# Patient Record
Sex: Male | Born: 1995 | Race: White | Hispanic: No | Marital: Married | Smoking: Never smoker
Health system: Southern US, Community
[De-identification: ages and names within clinical notes are randomized; demographics above are authoritative.]

---
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
## 2015-11-21 ENCOUNTER — Ambulatory Visit (INDEPENDENT_AMBULATORY_CARE_PROVIDER_SITE_OTHER): Payer: PRIVATE HEALTH INSURANCE | Admitting: Internal Medicine

## 2015-11-21 VITALS — BP 116/72 | HR 95 | Temp 98.1°F | Resp 18 | Ht 72.25 in | Wt 170.4 lb

## 2015-11-21 DIAGNOSIS — Z0000 Encounter for general adult medical examination without abnormal findings: Secondary | ICD-10-CM | POA: Diagnosis not present

## 2015-11-21 DIAGNOSIS — N529 Male erectile dysfunction, unspecified: Secondary | ICD-10-CM | POA: Diagnosis not present

## 2015-11-21 LAB — LIPID PANEL
CHOL/HDL RATIO: 4.4 ratio (ref <=5.0)
Cholesterol: 149 mg/dL (ref 125–170)
HDL: 34 mg/dL (ref 31–65)
LDL Cholesterol: 97 mg/dL (ref <110)
Triglycerides: 90 mg/dL (ref 38–152)
VLDL: 18 mg/dL (ref <30)

## 2015-11-21 LAB — TSH: TSH: 2.26 m[IU]/L (ref 0.50–4.30)

## 2015-11-21 NOTE — Patient Instructions (Signed)
     IF you received an x-ray today, you will receive an invoice from [facility]. Please contact [facility] at [phone] with questions or concerns regarding your invoice.   IF you received labwork today, you will receive an invoice from [facility]. Please contact [facility] at [phone] with questions or concerns regarding your invoice.   Our billing staff will not be able to assist you with questions regarding bills from these companies.  You will be contacted with the lab results as soon as they are available. The fastest way to get your results is to activate your My Chart account. Instructions are located on the last page of this paperwork. If you have not heard from us regarding the results in 2 weeks, please contact this office.      
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
## 2015-11-29 LAB — TESTOS,TOTAL,FREE AND SHBG (FEMALE)
Sex Hormone Binding Glob.: 36 nmol/L (ref 10–50)
Testosterone, Free: 117.6 pg/mL (ref 35.0–155.0)
Testosterone,Total,LC/MS/MS: 749 ng/dL (ref 250–1100)

## 2015-11-30 ENCOUNTER — Encounter: Payer: Self-pay | Admitting: *Deleted

## 2018-07-29 ENCOUNTER — Ambulatory Visit
Admission: RE | Admit: 2018-07-29 | Discharge: 2018-07-29 | Disposition: A | Discharge status: Home / Self Care | Payer: PRIVATE HEALTH INSURANCE | Source: Ambulatory Visit | Attending: Gastroenterology | Admitting: Gastroenterology

## 2018-07-29 ENCOUNTER — Other Ambulatory Visit: Payer: Self-pay | Admitting: Gastroenterology

## 2018-07-29 DIAGNOSIS — K5190 Ulcerative colitis, unspecified, without complications: Secondary | ICD-10-CM

## 2022-03-06 DIAGNOSIS — Z23 Encounter for immunization: Secondary | ICD-10-CM | POA: Diagnosis not present

## 2022-04-11 DIAGNOSIS — K51011 Ulcerative (chronic) pancolitis with rectal bleeding: Secondary | ICD-10-CM | POA: Diagnosis not present

## 2022-06-12 DIAGNOSIS — K5130 Ulcerative (chronic) rectosigmoiditis without complications: Secondary | ICD-10-CM | POA: Diagnosis not present

## 2022-06-12 DIAGNOSIS — Z79899 Other long term (current) drug therapy: Secondary | ICD-10-CM | POA: Diagnosis not present

## 2022-06-12 DIAGNOSIS — K51011 Ulcerative (chronic) pancolitis with rectal bleeding: Secondary | ICD-10-CM | POA: Diagnosis not present

## 2022-06-12 DIAGNOSIS — Z880 Allergy status to penicillin: Secondary | ICD-10-CM | POA: Diagnosis not present

## 2022-08-08 DIAGNOSIS — K51011 Ulcerative (chronic) pancolitis with rectal bleeding: Secondary | ICD-10-CM | POA: Diagnosis not present

## 2022-10-02 DIAGNOSIS — K51011 Ulcerative (chronic) pancolitis with rectal bleeding: Secondary | ICD-10-CM | POA: Diagnosis not present

## 2022-10-17 DIAGNOSIS — K6389 Other specified diseases of intestine: Secondary | ICD-10-CM | POA: Diagnosis not present

## 2022-10-17 DIAGNOSIS — Z880 Allergy status to penicillin: Secondary | ICD-10-CM | POA: Diagnosis not present

## 2022-10-17 DIAGNOSIS — K5130 Ulcerative (chronic) rectosigmoiditis without complications: Secondary | ICD-10-CM | POA: Diagnosis not present

## 2022-10-17 DIAGNOSIS — K5140 Inflammatory polyps of colon without complications: Secondary | ICD-10-CM | POA: Diagnosis not present

## 2022-10-17 DIAGNOSIS — K6289 Other specified diseases of anus and rectum: Secondary | ICD-10-CM | POA: Diagnosis not present

## 2022-10-17 DIAGNOSIS — K5100 Ulcerative (chronic) pancolitis without complications: Secondary | ICD-10-CM | POA: Diagnosis not present

## 2022-10-25 DIAGNOSIS — K5130 Ulcerative (chronic) rectosigmoiditis without complications: Secondary | ICD-10-CM | POA: Diagnosis not present

## 2022-10-25 DIAGNOSIS — R197 Diarrhea, unspecified: Secondary | ICD-10-CM | POA: Diagnosis not present

## 2022-12-03 DIAGNOSIS — K51011 Ulcerative (chronic) pancolitis with rectal bleeding: Secondary | ICD-10-CM | POA: Diagnosis not present

## 2023-01-28 DIAGNOSIS — K51011 Ulcerative (chronic) pancolitis with rectal bleeding: Secondary | ICD-10-CM | POA: Diagnosis not present

## 2023-03-25 DIAGNOSIS — K51011 Ulcerative (chronic) pancolitis with rectal bleeding: Secondary | ICD-10-CM | POA: Diagnosis not present

## 2023-05-20 DIAGNOSIS — K51011 Ulcerative (chronic) pancolitis with rectal bleeding: Secondary | ICD-10-CM | POA: Diagnosis not present

## 2023-07-15 DIAGNOSIS — K51011 Ulcerative (chronic) pancolitis with rectal bleeding: Secondary | ICD-10-CM | POA: Diagnosis not present

## 2023-08-06 DIAGNOSIS — D84821 Immunodeficiency due to drugs: Secondary | ICD-10-CM | POA: Diagnosis not present

## 2023-08-06 DIAGNOSIS — Z800 Family history of malignant neoplasm of digestive organs: Secondary | ICD-10-CM | POA: Diagnosis not present

## 2023-08-06 DIAGNOSIS — Z79899 Other long term (current) drug therapy: Secondary | ICD-10-CM | POA: Diagnosis not present

## 2023-08-06 DIAGNOSIS — Z880 Allergy status to penicillin: Secondary | ICD-10-CM | POA: Diagnosis not present

## 2023-08-06 DIAGNOSIS — K5130 Ulcerative (chronic) rectosigmoiditis without complications: Secondary | ICD-10-CM | POA: Diagnosis not present

## 2023-09-09 DIAGNOSIS — K51011 Ulcerative (chronic) pancolitis with rectal bleeding: Secondary | ICD-10-CM | POA: Diagnosis not present

## 2023-11-04 DIAGNOSIS — Z3009 Encounter for other general counseling and advice on contraception: Secondary | ICD-10-CM | POA: Diagnosis not present

## 2023-11-04 DIAGNOSIS — K51011 Ulcerative (chronic) pancolitis with rectal bleeding: Secondary | ICD-10-CM | POA: Diagnosis not present

## 2023-12-30 DIAGNOSIS — K51011 Ulcerative (chronic) pancolitis with rectal bleeding: Secondary | ICD-10-CM | POA: Diagnosis not present

## 2023-12-31 ENCOUNTER — Encounter: Payer: Self-pay | Admitting: Family Medicine

## 2023-12-31 ENCOUNTER — Ambulatory Visit: Payer: Self-pay | Admitting: Family Medicine

## 2023-12-31 VITALS — BP 126/68 | HR 89 | Temp 97.8°F | Ht 72.0 in | Wt 170.0 lb

## 2023-12-31 DIAGNOSIS — K5190 Ulcerative colitis, unspecified, without complications: Secondary | ICD-10-CM | POA: Diagnosis not present

## 2023-12-31 DIAGNOSIS — L03316 Cellulitis of umbilicus: Secondary | ICD-10-CM | POA: Diagnosis not present

## 2023-12-31 DIAGNOSIS — H04121 Dry eye syndrome of right lacrimal gland: Secondary | ICD-10-CM | POA: Diagnosis not present

## 2023-12-31 DIAGNOSIS — E786 Lipoprotein deficiency: Secondary | ICD-10-CM

## 2023-12-31 DIAGNOSIS — D508 Other iron deficiency anemias: Secondary | ICD-10-CM

## 2023-12-31 DIAGNOSIS — Z7689 Persons encountering health services in other specified circumstances: Secondary | ICD-10-CM

## 2023-12-31 DIAGNOSIS — L989 Disorder of the skin and subcutaneous tissue, unspecified: Secondary | ICD-10-CM

## 2023-12-31 MED ORDER — MUPIROCIN 2 % EX OINT: 1.0000 | TOPICAL_OINTMENT | Freq: Two times a day (BID) | CUTANEOUS | 0 refills | Status: AC

## 2023-12-31 MED ORDER — KETOCONAZOLE 1 % EX SHAM: 1.0000 | MEDICATED_SHAMPOO | CUTANEOUS | 0 refills | Status: AC

## 2023-12-31 NOTE — Progress Notes (Signed)
 Assessment & Plan   Assessment/Plan:    Assessment & Plan Ulcerative colitis, stable on biologic therapy Ulcerative colitis diagnosed at the end of 2019. Currently well-managed on Remicade (infliximab) 400 mg every 8 weeks with no symptoms reported in years. - Continue Remicade therapy. - Regular follow-up with gastroenterologist at [facility].  Iron deficiency on supplementation Iron deficiency noted on last metabolic panel six months ago. Currently on ferrous sulfate supplementation without gastrointestinal issues. - Continue ferrous sulfate supplementation. - Continue folic acid supplementation.  Dry eye syndrome after LASIK Dry eye syndrome post-LASIK surgery two years ago. Uses ointment at night as recommended by ophthalmologist. - Continue night-time ointment use.  Right lateral canthal dermatitis Redness at the right lateral canthus, possibly due to skin changes from drainage or moisture. Differential includes fungal infection, though considered unlikely. - Recommend over-the-counter Nizoral  (ketoconazole ) 1% shampoo for use as a wash at the right lateral canthus.  Periumbilical dermatitis Redness and scaling in the umbilical region with clear drainage. Possible mild umbilical dermatitis due to skin breakdown and debris collection. - Prescribe mupirocin  topical ointment for application twice daily to the umbilical region.  Low HDL cholesterol Previously noted low HDL cholesterol, slightly below normal. Currently taking niacin daily. Discussed that niacin affects cholesterol but has not been shown to improve outcomes. - Advise lifestyle modifications such as exercise and diet.      There are no discontinued medications.  Return if symptoms worsen or fail to improve.        Subjective:   Encounter date: 12/31/2023  Dustin Howard is a 28 y.o. male who has Dry eye syndrome of right eye; Iron deficiency anemia secondary to inadequate dietary iron intake; Ulcerative  colitis without complications (HCC); Skin lesion of face; Cellulitis of umbilicus; and Low HDL (under 40) on their problem list..   He  has no past medical history on file.SABRA   He presents with chief complaint of Establish Care (Redness near eyes  was left now right ey also have redness in belly button /) .   Discussed the use of AI scribe software for clinical note transcription with the patient, who gave verbal consent to proceed.  History of Present Illness Dustin Howard is a 28 year old male with ulcerative colitis who presents to establish care with a primary care provider.  He moved to [place] about a year ago and is establishing care with a primary care provider. He has been under the care of a gastroenterologist for ulcerative colitis since 2020. He switched from his previous provider due to inadequate symptom management but is satisfied with his current care despite the inconvenience of travel.  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 He has not experienced symptoms in years and feels 'very fortunate.' His current treatment includes infliximab (Remicade) at 400 mg every eight weeks, alongside 20 mg of methylprednisolone. He also takes iron supplements due to a past iron deficiency noted in a metabolic panel six months ago. No gastrointestinal issues with the iron supplementation. No current symptoms of ulcerative colitis.  He underwent Lasik surgery two years ago and experiences some dryness in his eyes, for which he uses ointment at night. He reports redness in the corner of his right eye, which he describes as similar to angular cheilitis, and redness in his belly button, which does not itch or hurt but does scale. He has not tried any specific treatment for these areas.  He has had routine blood work every two months, including CBC, AST, ALT, and CRP, with an annual metabolic panel. His last A1c was  checked ten months ago and was 5.0. He takes niacin daily to address a slightly low HDL level noted ten months ago.     ROS  History reviewed. No pertinent surgical history.  Outpatient Medications Prior to Visit  Medication Sig Dispense Refill   acetaminophen (TYLENOL) 325 MG tablet      cetirizine (ZYRTEC ALLERGY) 10 MG tablet      Cholecalciferol (VITAMIN D3) 25 MCG (1000 UT) CAPS Take by mouth daily.     diazepam (VALIUM) 10 MG tablet Take 10 mg by mouth daily.     ferrous sulfate 325 (65 FE) MG tablet Take 325 mg by mouth.     folic acid (FOLVITE) 1 MG tablet Take 1 mg by mouth daily.     inFLIXimab-abda (RENFLEXIS) 100 MG SOLR INFUSE 400MG  INTRAVENOUSLY  EVERY 8 WEEKS     inFLIXimab-axxq in sodium chloride 0.9 %      Inositol Niacinate (NIACIN FLUSH FREE) 500 MG CAPS      methylPREDNISolone sodium succinate in dextrose solution      Niacin, Antihyperlipidemic, 500 MG TABS Take 500 mg by mouth.     Simethicone 125 MG CAPS      No facility-administered medications prior to visit.    History reviewed. No pertinent family history.  Social History   Socioeconomic History   Marital status: Married    Spouse name: Not on file   Number of children: Not on file   Years of education: Not on file   Highest education level: Not on file  Occupational History   Not on file  Tobacco Use   Smoking status: Never   Smokeless tobacco: Not on file  Substance and Sexual Activity   Alcohol use: Not on file   Drug use: Not on file   Sexual activity: Not on file  Other Topics Concern   Not on file  Social History Narrative   Not on file   Social Drivers of Health   Financial Resource Strain: Not on file  Food Insecurity: Not on file  Transportation Needs: Not on file  Physical Activity: Insufficiently Active (08/01/2018)   Received from [facility]   Exercise Vital Sign    Days of Exercise per Week: 3 days    Minutes of Exercise per Session: 40 min  Stress: Not on file   Social Connections: Not on file  Intimate Partner Violence: Not on file                                                                                                  Objective:  Physical Exam: BP 126/68 (BP Location: Right Arm, Patient Position: Sitting, Cuff Size: Small)   Pulse 89   Temp 97.8 F (36.6 C) (Temporal)   Ht 6' (1.829 m)   Wt 170 lb (77.1 kg)   SpO2 99%   BMI 23.06 kg/m    Physical Exam GENERAL: Alert, cooperative, well developed, no acute distress. HEENT:  Normocephalic, normal oropharynx, moist mucous membranes, tearing in the right eye, no scaling around the right eye. CHEST: Clear to auscultation bilaterally, no wheezes, rhonchi, or crackles. CARDIOVASCULAR: Normal heart rate and rhythm, S1 and S2 normal without murmurs. ABDOMEN: Soft, non-tender, non-distended, without organomegaly, normal bowel sounds, lint and mild skin breakdown in the umbilicus, clear drainage in the umbilicus. EXTREMITIES: No cyanosis or edema. NEUROLOGICAL: Cranial nerves grossly intact, moves all extremities without gross motor or sensory deficit.   Physical Exam  No results found.  No results found for this or any previous visit (from the past 2160 hours).      Beverley Adine Hummer, MD, MS

## 2023-12-31 NOTE — Patient Instructions (Signed)
  VISIT SUMMARY: Dustin Howard, a 28 year old male with ulcerative colitis, visited to establish care with a primary care provider. He has been managing his condition with a gastroenterologist and is currently symptom-free. He also reported issues with dry eyes post-LASIK surgery, redness in the corner of his right eye, and redness in his belly button. His routine blood work and other health maintenance were reviewed.  YOUR PLAN: -ULCERATIVE COLITIS: Ulcerative colitis is a chronic condition that causes inflammation and sores in the digestive tract. Your condition is currently well-managed with Remicade (infliximab) 400 mg every 8 weeks, and you should continue this therapy. Regular follow-ups with your gastroenterologist at [facility] are recommended.  -IRON DEFICIENCY: Iron deficiency occurs when your body doesn't have enough iron, which is essential for making red blood cells. You should continue taking ferrous sulfate and folic acid supplements as previously prescribed.  -DRY EYE SYNDROME: Dry eye syndrome is a condition where your eyes do not produce enough tears or the right quality of tears. Continue using the ointment at night as recommended by your ophthalmologist.  -RIGHT LATERAL CANTHAL DERMATITIS: This is a skin condition causing redness at the corner of your right eye, possibly due to moisture or drainage. Use over-the-counter Nizoral  (ketoconazole ) 1% shampoo as a wash at the affected area.  -PERIUMBILICAL DERMATITIS: This is a skin condition causing redness and scaling around your belly button, likely due to skin breakdown and debris. Apply mupirocin  topical ointment twice daily to the affected area.  -LOW HDL CHOLESTEROL: HDL cholesterol is the 'good' cholesterol that helps remove other forms of cholesterol from your bloodstream. Your levels are slightly low. Continue taking niacin daily and consider lifestyle modifications such as exercise and a healthy diet to improve your cholesterol  levels.  INSTRUCTIONS: Please continue with your current medications and follow the new recommendations provided. Schedule regular follow-ups with your gastroenterologist at [facility] and maintain routine blood work as discussed. If you experience any new symptoms or have concerns, contact our office.

## 2024-02-18 DIAGNOSIS — Z23 Encounter for immunization: Secondary | ICD-10-CM | POA: Diagnosis not present

## 2024-02-18 DIAGNOSIS — K5130 Ulcerative (chronic) rectosigmoiditis without complications: Secondary | ICD-10-CM | POA: Diagnosis not present

## 2024-02-18 DIAGNOSIS — Z79899 Other long term (current) drug therapy: Secondary | ICD-10-CM | POA: Diagnosis not present

## 2024-02-18 DIAGNOSIS — D84821 Immunodeficiency due to drugs: Secondary | ICD-10-CM | POA: Diagnosis not present

## 2024-02-27 DIAGNOSIS — K51011 Ulcerative (chronic) pancolitis with rectal bleeding: Secondary | ICD-10-CM | POA: Diagnosis not present

## 2024-02-27 DIAGNOSIS — Z79899 Other long term (current) drug therapy: Secondary | ICD-10-CM | POA: Diagnosis not present

## 2024-04-14 ENCOUNTER — Encounter: Admitting: Family Medicine

## 2024-04-20 DIAGNOSIS — K51011 Ulcerative (chronic) pancolitis with rectal bleeding: Secondary | ICD-10-CM | POA: Diagnosis not present

## 2024-05-01 ENCOUNTER — Ambulatory Visit: Admitting: Family Medicine

## 2024-05-01 VITALS — BP 115/67 | HR 75 | Temp 97.9°F | Ht 72.0 in | Wt 196.0 lb

## 2024-05-01 DIAGNOSIS — Z0000 Encounter for general adult medical examination without abnormal findings: Secondary | ICD-10-CM | POA: Diagnosis not present

## 2024-05-01 DIAGNOSIS — D508 Other iron deficiency anemias: Secondary | ICD-10-CM | POA: Diagnosis not present

## 2024-05-01 DIAGNOSIS — K5190 Ulcerative colitis, unspecified, without complications: Secondary | ICD-10-CM | POA: Diagnosis not present

## 2024-05-01 DIAGNOSIS — L989 Disorder of the skin and subcutaneous tissue, unspecified: Secondary | ICD-10-CM

## 2024-05-01 NOTE — Progress Notes (Signed)
 Assessment  Assessment/Plan:  Assessment and Plan Assessment & Plan Ulcerative colitis Well-managed with Remicade . No recent flare-ups or complications. Dietary modifications include avoiding red meat and using anti-inflammatory foods. - Continue Remicade  therapy. - Maintain current dietary modifications.  Facial skin lesion Persistent redness at the corner of the eye, possibly related to Remicade  use. No prior dermatological evaluation. - Referred to dermatologist for evaluation of facial skin lesion.  Dry eye syndrome Managed with occasional use of eye drops and preservative-free eye ointment at night. Symptoms are mild and manageable. - Continue current management with eye drops and preservative-free eye ointment.  Iron deficiency anemia Managed with oral iron supplementation. Levels are stable with current regimen. - Continue oral iron supplementation as currently prescribed.  General Health Maintenance Discussion on preventive health measures including diet, exercise, and screenings. Emphasis on heart disease prevention through lifestyle modifications. No recent cholesterol check since 2017. Discussed the importance of regular screenings and lifestyle modifications to prevent heart disease. - Ordered fasting lipid panel and A1c test. - Ordered urine analysis. - Ordered TSH test. - Referred to dermatologist for full body skin check. - Encouraged continuation of healthy lifestyle habits including diet and exercise.      Medications Discontinued During This Encounter  Medication Reason   diazepam (VALIUM) 10 MG tablet     Patient Counseling(The following topics were reviewed and/or handout was given):  -Nutrition: Stressed importance of moderation in sodium/caffeine intake, saturated fat and cholesterol, caloric balance, sufficient intake of fresh fruits, vegetables, and fiber.  -Stressed the importance of regular exercise.   -Substance Abuse: Discussed  cessation/primary prevention of tobacco, alcohol, or other drug use; driving or other dangerous activities under the influence; availability of treatment for abuse.   -Injury prevention: Discussed safety belts, safety helmets, smoke detector, smoking near bedding or upholstery.   -Sexuality: Discussed sexually transmitted diseases, partner selection, use of condoms, avoidance of unintended pregnancy and contraceptive alternatives.   -Dental health: Discussed importance of regular tooth brushing, flossing, and dental visits.  -Health maintenance and immunizations reviewed. Please refer to Health maintenance section.  Return for physical (fasting labs).        Subjective:   Encounter date: 05/01/2024  Chief Complaint  Patient presents with   Annual Exam    Pt presents today for Cpe, no specific questions or concerns & pt not fasting     Discussed the use of AI scribe software for clinical note transcription with the patient, who gave verbal consent to proceed.  History of Present Illness          Ulcerative Colitis - Diagnosed in 2019.  - Managed with Remicade  (infliximab ) 400 mg Q8 weeks. - Followed by [facility].   Iron Deficiency Anemia - Managed with ferrous sulfate 65 mg daily.   Low HDL Cholesterol  - Managed with Niacin 500 mg daily.      05/01/2024    3:51 PM 12/31/2023    4:54 PM 11/21/2015    9:04 AM  Depression screen PHQ 2/9  Decreased Interest 0 0 0  Down, Depressed, Hopeless 0 0 0  PHQ - 2 Score 0 0 0  Altered sleeping 0 0   Tired, decreased energy 0 0   Change in appetite 0 0   Feeling bad or failure about yourself  0 0   Trouble concentrating 0 0   Moving slowly or fidgety/restless 0 0   Suicidal thoughts 0 0   PHQ-9 Score 0 0    Difficult doing work/chores  Not difficult at all Not difficult at all      Data saved with a previous flowsheet row definition       05/01/2024    3:51 PM 12/31/2023    4:54 PM  GAD 7 : Generalized Anxiety Score   Nervous, Anxious, on Edge 0 0  Control/stop worrying 0 0  Worry too much - different things 0 0  Trouble relaxing 0 0  Restless 0 0  Easily annoyed or irritable 0 0  Afraid - awful might happen 0 0  Total GAD 7 Score 0 0  Anxiety Difficulty Not difficult at all Not difficult at all    Health Maintenance Due  Topic Date Due   HIV Screening  Never done   Hepatitis C Screening  Never done   HPV VACCINES (1 - 3-dose SCDM series) Never done   COVID-19 Vaccine (2 - Pfizer risk series) 03/10/2024     PMH:  The following were reviewed and entered/updated in epic: History reviewed. No pertinent past medical history.  Patient Active Problem List   Diagnosis Date Noted   Dry eye syndrome of right eye 12/31/2023   Iron deficiency anemia secondary to inadequate dietary iron intake 12/31/2023   Ulcerative colitis without complications (HCC) 12/31/2023   Skin lesion of face 12/31/2023   Cellulitis of umbilicus 12/31/2023   Low HDL (under 40) 12/31/2023    History reviewed. No pertinent surgical history.  History reviewed. No pertinent family history.  Medications- reviewed and updated Outpatient Medications Prior to Visit  Medication Sig Dispense Refill   acetaminophen (TYLENOL) 325 MG tablet      cetirizine (ZYRTEC ALLERGY) 10 MG tablet      Cholecalciferol (VITAMIN D3) 25 MCG (1000 UT) CAPS Take by mouth daily.     ferrous sulfate 325 (65 FE) MG tablet Take 325 mg by mouth.     folic acid (FOLVITE) 1 MG tablet Take 1 mg by mouth daily.     inFLIXimab -abda (RENFLEXIS ) 100 MG SOLR INFUSE 400MG  INTRAVENOUSLY  EVERY 8 WEEKS     inFLIXimab -axxq in sodium chloride 0.9 %      Inositol Niacinate (NIACIN FLUSH FREE) 500 MG CAPS      KETOCONAZOLE , TOPICAL, 1 % SHAM Apply 1 Application topically 2 (two) times a week. 200 mL 0   methylPREDNISolone  sodium succinate in dextrose solution      Niacin, Antihyperlipidemic, 500 MG TABS Take 500 mg by mouth.     Simethicone 125 MG CAPS       mupirocin  ointment (BACTROBAN ) 2 % Apply 1 Application topically 2 (two) times daily. (Patient not taking: Reported on 05/01/2024) 22 g 0   diazepam (VALIUM) 10 MG tablet Take 10 mg by mouth daily. (Patient not taking: Reported on 05/01/2024)     No facility-administered medications prior to visit.     Allergies[1]  Social History   Socioeconomic History   Marital status: Married    Spouse name: Not on file   Number of children: Not on file   Years of education: Not on file   Highest education level: Professional school degree (e.g., MD, DDS, DVM, JD)  Occupational History   Not on file  Tobacco Use   Smoking status: Never   Smokeless tobacco: Not on file  Substance and Sexual Activity   Alcohol use: Not on file   Drug use: Not on file   Sexual activity: Not on file  Other Topics Concern   Not on file  Social History Narrative  Not on file   Social Drivers of Health   Tobacco Use: Unknown (05/06/2024)   Patient History    Smoking Tobacco Use: Never    Smokeless Tobacco Use: Unknown    Passive Exposure: Not on file  Financial Resource Strain: Low Risk (05/01/2024)   Overall Financial Resource Strain (CARDIA)    Difficulty of Paying Living Expenses: Not hard at all  Food Insecurity: No Food Insecurity (05/01/2024)   Epic    Worried About Radiation Protection Practitioner of Food in the Last Year: Never true    Ran Out of Food in the Last Year: Never true  Transportation Needs: No Transportation Needs (05/01/2024)   Epic    Lack of Transportation (Medical): No    Lack of Transportation (Non-Medical): No  Physical Activity: Sufficiently Active (05/01/2024)   Exercise Vital Sign    Days of Exercise per Week: 7 days    Minutes of Exercise per Session: 40 min  Stress: Stress Concern Present (05/01/2024)   [employer] of Occupational Health - Occupational Stress Questionnaire    Feeling of Stress: To some extent  Social Connections: Socially Integrated (05/01/2024)   Social  Connection and Isolation Panel    Frequency of Communication with Friends and Family: More than three times a week    Frequency of Social Gatherings with Friends and Family: Twice a week    Attends Religious Services: More than 4 times per year    Active Member of [employer] or Organizations: Yes    Attends Banker Meetings: More than 4 times per year    Marital Status: Married  Depression (PHQ2-9): Low Risk (05/01/2024)   Depression (PHQ2-9)    PHQ-2 Score: 0  Alcohol Screen: Not on file  Housing: Low Risk (05/01/2024)   Epic    Unable to Pay for Housing in the Last Year: No    Number of Times Moved in the Last Year: 0    Homeless in the Last Year: No  Utilities: Not on file  Health Literacy: Not on file           Objective:  Physical Exam: BP 115/67   Pulse 75   Temp 97.9 F (36.6 C)   Ht 6' (1.829 m)   Wt 196 lb (88.9 kg)   SpO2 100%   BMI 26.58 kg/m   Body mass index is 26.58 kg/m. Wt Readings from Last 3 Encounters:  05/01/24 196 lb (88.9 kg)  12/31/23 170 lb (77.1 kg)  11/21/15 170 lb 6.4 oz (77.3 kg) (72%, Z= 0.57)*   * Growth percentiles are based on CDC (Boys, 2-20 Years) data.    Physical Exam          Physical Exam      Prior labs:   Recent Results (from the past 2160 hours)  Urinalysis w microscopic + reflex cultur     Status: Abnormal   Collection Time: 05/01/24  4:53 PM   Specimen: Urine  Result Value Ref Range   Color, Urine YELLOW YELLOW   APPearance CLEAR CLEAR   Specific Gravity, Urine 1.012 1.001 - 1.035   pH 5.5 5.0 - 8.0   Glucose, UA NEGATIVE NEGATIVE   Bilirubin Urine NEGATIVE NEGATIVE   Ketones, ur NEGATIVE NEGATIVE   Hgb urine dipstick TRACE (A) NEGATIVE   Protein, ur NEGATIVE NEGATIVE   Nitrites, Initial NEGATIVE NEGATIVE   Leukocyte Esterase NEGATIVE NEGATIVE   WBC, UA NONE SEEN 0 - 5 /HPF   RBC / HPF NONE SEEN 0 -  2 /HPF   Squamous Epithelial / HPF NONE SEEN < OR = 5 /HPF   Bacteria, UA NONE SEEN NONE SEEN  /HPF   Hyaline Cast NONE SEEN NONE SEEN /LPF   Note      Comment: This urine was analyzed for the presence of WBC,  RBC, bacteria, casts, and other formed elements.  Only those elements seen were reported. . .   REFLEXIVE URINE CULTURE     Status: None   Collection Time: 05/01/24  4:53 PM  Result Value Ref Range   Reflexve Urine Culture      Comment: NO CULTURE INDICATED  Microalbumin / creatinine urine ratio     Status: None   Collection Time: 05/04/24  3:59 PM  Result Value Ref Range   Microalb, Ur <0.7 mg/dL   Creatinine,U 842.6 mg/dL   Microalb Creat Ratio Unable to calculate 0.0 - 30.0 mg/g    Comment: Unable to Calculate due to Microalbumin Result of <0.7 mg/dL    Lab Results  Component Value Date   CHOL 149 11/21/2015   Lab Results  Component Value Date   HDL 34 11/21/2015   Lab Results  Component Value Date   LDLCALC 97 11/21/2015   Lab Results  Component Value Date   TRIG 90 11/21/2015   Lab Results  Component Value Date   CHOLHDL 4.4 11/21/2015   No results found for: LDLDIRECT  Last metabolic panel No results found for: GLUCOSE, NA, K, CL, CO2, BUN, CREATININE, EGFR, CALCIUM, PHOS, PROT, ALBUMIN, LABGLOB, AGRATIO, BILITOT, ALKPHOS, AST, ALT, ANIONGAP  No results found for: HGBA1C  Last CBC No results found for: WBC, HGB, HCT, MCV, MCH, RDW, PLT  Lab Results  Component Value Date   TSH 2.26 11/21/2015    No results found for: PSA1, PSA  Last vitamin D No results found for: MARIEN BOLLS, VD25OH  Lab Results  Component Value Date   PROTEINUR NEGATIVE 05/01/2024    Lab Results  Component Value Date   MICROALBUR <0.7 05/04/2024     At today's visit, we discussed treatment options, associated risk and benefits, and engage in counseling as needed.  Additionally the following were reviewed: Past medical records, past medical and surgical history, family and social  background, as well as relevant laboratory results, imaging findings, and specialty notes, where applicable.  This message was generated using dictation software, and as a result, it may contain unintentional typos or errors.  Nevertheless, extensive effort was made to accurately convey at the pertinent aspects of the patient visit.    There may have been are other unrelated non-urgent complaints, but due to the busy schedule and the amount of time already spent with him, time does not permit to address these issues at today's visit. Another appointment may have or has been requested to review these additional issues.     Beverley KATHEE Hummer, MD  I,Emily Lagle,acting as a scribe for Beverley KATHEE Hummer, MD.,have documented all relevant documentation on the behalf of Beverley KATHEE Hummer, MD.  LILLETTE Beverley KATHEE Hummer, MD, have reviewed all documentation for this visit. The documentation on 05/01/2024 for the exam, diagnosis, procedures, and orders are all accurate and complete.     [1]  Allergies Allergen Reactions   Amoxicillin Rash

## 2024-05-01 NOTE — Patient Instructions (Signed)
 It was very nice to see you today!       Take care, Arvella Hummer, MD, MS   PLEASE NOTE:  If you had any lab tests, please let us  know if you have not heard back within a few days. You may see your results on mychart before we have a chance to review them but we will give you a call once they are reviewed by us .   If we ordered any referrals today, please let us  know if you have not heard from their office within the next week.   If you had any urgent prescriptions sent in today, please check with the pharmacy within an hour of our visit to make sure the prescription was transmitted appropriately.   Please try these tips to maintain a healthy lifestyle:  Eat at least 3 REAL meals and 1-2 snacks per day.  Aim for no more than 5 hours between eating.  If you eat breakfast, please do so within one hour of getting up.   Each meal should contain half fruits/vegetables, one quarter protein, and one quarter carbs (no bigger than a computer mouse)  Cut down on sweet beverages. This includes juice, soda, and sweet tea.   Drink at least 1 glass of water with each meal and aim for at least 8 glasses per day  Exercise at least 150 minutes every week.

## 2024-05-02 LAB — URINALYSIS W MICROSCOPIC + REFLEX CULTURE
Bacteria, UA: NONE SEEN /HPF
Bilirubin Urine: NEGATIVE
Glucose, UA: NEGATIVE
Hyaline Cast: NONE SEEN /LPF
Ketones, ur: NEGATIVE
Leukocyte Esterase: NEGATIVE
Nitrites, Initial: NEGATIVE
Protein, ur: NEGATIVE
RBC / HPF: NONE SEEN /HPF (ref 0–2)
Specific Gravity, Urine: 1.012 (ref 1.001–1.035)
Squamous Epithelial / HPF: NONE SEEN /HPF (ref <=5)
WBC, UA: NONE SEEN /HPF (ref 0–5)
pH: 5.5 (ref 5.0–8.0)

## 2024-05-02 LAB — NO CULTURE INDICATED

## 2024-05-04 ENCOUNTER — Other Ambulatory Visit

## 2024-05-05 LAB — MICROALBUMIN / CREATININE URINE RATIO
Creatinine,U: 157.3 mg/dL
Microalb Creat Ratio: UNDETERMINED mg/g (ref 0.0–30.0)
Microalb, Ur: <0.7 mg/dL

## 2024-05-06 ENCOUNTER — Encounter: Payer: Self-pay | Admitting: Family Medicine

## 2024-05-06 ENCOUNTER — Ambulatory Visit: Payer: Self-pay | Admitting: Family Medicine

## 2024-12-30 ENCOUNTER — Ambulatory Visit: Admitting: Physician Assistant
